# Patient Record
Sex: Male | Born: 1980 | Race: Black or African American | Hispanic: No | Marital: Single | Smoking: Current some day smoker
Health system: Southern US, Community
[De-identification: ages and names within clinical notes are randomized; demographics above are authoritative.]

## PROBLEM LIST (undated history)

## (undated) DIAGNOSIS — I10 Essential (primary) hypertension: Secondary | ICD-10-CM

---
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
## 2002-01-09 ENCOUNTER — Encounter: Payer: Self-pay | Admitting: Emergency Medicine

## 2002-01-09 ENCOUNTER — Emergency Department (HOSPITAL_COMMUNITY): Admission: EM | Admit: 2002-01-09 | Discharge: 2002-01-09 | Payer: Self-pay | Admitting: Emergency Medicine

## 2004-06-04 ENCOUNTER — Ambulatory Visit: Payer: Self-pay | Admitting: Internal Medicine

## 2004-06-20 ENCOUNTER — Ambulatory Visit: Payer: Self-pay | Admitting: Internal Medicine

## 2004-06-20 ENCOUNTER — Ambulatory Visit: Payer: Self-pay | Admitting: *Deleted

## 2004-10-14 ENCOUNTER — Ambulatory Visit: Payer: Self-pay | Admitting: Internal Medicine

## 2004-10-15 ENCOUNTER — Ambulatory Visit (HOSPITAL_COMMUNITY): Admission: RE | Admit: 2004-10-15 | Discharge: 2004-10-15 | Payer: Self-pay | Admitting: Internal Medicine

## 2004-10-21 ENCOUNTER — Ambulatory Visit: Payer: Self-pay | Admitting: Internal Medicine

## 2004-11-10 ENCOUNTER — Ambulatory Visit: Payer: Self-pay | Admitting: Internal Medicine

## 2005-05-18 ENCOUNTER — Ambulatory Visit: Payer: Self-pay | Admitting: Internal Medicine

## 2005-06-12 ENCOUNTER — Ambulatory Visit: Payer: Self-pay | Admitting: Internal Medicine

## 2007-04-07 HISTORY — PX: SPINE SURGERY: SHX786

## 2007-06-13 ENCOUNTER — Inpatient Hospital Stay (HOSPITAL_COMMUNITY): Admission: AC | Admit: 2007-06-13 | Discharge: 2007-06-21 | Payer: Self-pay

## 2008-04-25 ENCOUNTER — Emergency Department (HOSPITAL_COMMUNITY): Admission: EM | Admit: 2008-04-25 | Discharge: 2008-04-25 | Payer: Self-pay | Admitting: Emergency Medicine
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
## 2010-12-29 LAB — CBC
HCT: 27.4 — ABNORMAL LOW
HCT: 37.4 — ABNORMAL LOW
Hemoglobin: 11.8 — ABNORMAL LOW
Hemoglobin: 12 — ABNORMAL LOW
Hemoglobin: 9.6 — ABNORMAL LOW
Hemoglobin: 9.8 — ABNORMAL LOW
MCHC: 33.8
MCHC: 34.2
MCHC: 34.4
MCHC: 34.8
MCHC: 35
MCV: 92.7
MCV: 92.9
MCV: 93
MCV: 93.3
MCV: 93.8
MCV: 94.2
MCV: 94.4
Platelets: 175
Platelets: 177
Platelets: 216
Platelets: 261
RBC: 2.9 — ABNORMAL LOW
RBC: 3.03 — ABNORMAL LOW
RBC: 3.63 — ABNORMAL LOW
RBC: 3.78 — ABNORMAL LOW
RBC: 4.34
RDW: 12.4
RDW: 12.5
WBC: 10.8 — ABNORMAL HIGH
WBC: 16.4 — ABNORMAL HIGH
WBC: 8.2

## 2010-12-29 LAB — BASIC METABOLIC PANEL
BUN: 6
BUN: 7
BUN: 9
CO2: 26
CO2: 28
CO2: 28
CO2: 28
CO2: 28
Calcium: 8.3 — ABNORMAL LOW
Calcium: 8.3 — ABNORMAL LOW
Calcium: 9
Chloride: 100
Chloride: 103
Chloride: 96
Chloride: 98
Chloride: 99
Chloride: 99
Creatinine, Ser: 0.72
Creatinine, Ser: 0.74
Creatinine, Ser: 0.87
Creatinine, Ser: 0.91
GFR calc Af Amer: >60
GFR calc Af Amer: >60
GFR calc Af Amer: >60
Glucose, Bld: 103 — ABNORMAL HIGH
Glucose, Bld: 128 — ABNORMAL HIGH
Potassium: 3.7
Potassium: 4
Sodium: 132 — ABNORMAL LOW
Sodium: 135
Sodium: 138

## 2010-12-29 LAB — I-STAT 8, (EC8 V) (CONVERTED LAB)
Acid-base deficit: 3 — ABNORMAL HIGH
BUN: 9
Bicarbonate: 20.3
HCT: 41
Hemoglobin: 13.9
Operator id: 277751
Sodium: 141
TCO2: 21
pCO2, Ven: 29.5 — ABNORMAL LOW

## 2010-12-29 LAB — CULTURE, BLOOD (ROUTINE X 2): Culture: NO GROWTH

## 2010-12-29 LAB — URINE CULTURE
Colony Count: NO GROWTH
Culture: NO GROWTH

## 2010-12-29 LAB — POCT I-STAT CREATININE: Creatinine, Ser: 1

## 2010-12-29 LAB — URINALYSIS, ROUTINE W REFLEX MICROSCOPIC
Nitrite: NEGATIVE
Protein, ur: NEGATIVE
Specific Gravity, Urine: 1.022
Urobilinogen, UA: 1

## 2010-12-29 LAB — ABO/RH: ABO/RH(D): B POS

## 2010-12-29 LAB — TYPE AND SCREEN: Antibody Screen: NEGATIVE

## 2010-12-29 LAB — URINE MICROSCOPIC-ADD ON

## 2010-12-29 LAB — PROTIME-INR
INR: 1.2
INR: 1.3
Prothrombin Time: 15.1
Prothrombin Time: 15.5 — ABNORMAL HIGH

## 2010-12-29 LAB — GENTAMICIN LEVEL, RANDOM: Gentamicin Rm: <0.5

## 2010-12-29 LAB — ETHANOL: Alcohol, Ethyl (B): 175 — ABNORMAL HIGH

## 2012-05-30 ENCOUNTER — Ambulatory Visit (INDEPENDENT_AMBULATORY_CARE_PROVIDER_SITE_OTHER): Payer: BC Managed Care – PPO | Admitting: Emergency Medicine

## 2012-05-30 VITALS — BP 110/70 | HR 65 | Temp 97.9°F | Resp 16 | Ht 69.5 in | Wt 136.0 lb

## 2012-05-30 DIAGNOSIS — F1010 Alcohol abuse, uncomplicated: Secondary | ICD-10-CM

## 2012-05-30 DIAGNOSIS — M549 Dorsalgia, unspecified: Secondary | ICD-10-CM

## 2012-05-30 DIAGNOSIS — F1020 Alcohol dependence, uncomplicated: Secondary | ICD-10-CM

## 2012-05-30 DIAGNOSIS — Z0000 Encounter for general adult medical examination without abnormal findings: Secondary | ICD-10-CM

## 2012-05-30 LAB — POCT CBC
HCT, POC: 45.7 % (ref 43.5–53.7)
Hemoglobin: 15.1 g/dL (ref 14.1–18.1)
Lymph, poc: 1.3 (ref 0.6–3.4)
MCH, POC: 30.7 pg (ref 27–31.2)
MCHC: 33 g/dL (ref 31.8–35.4)
MCV: 92.9 fL (ref 80–97)
MPV: 9.6 fL (ref 0–99.8)
POC MID %: 4.6 %M (ref 0–12)
RBC: 4.92 M/uL (ref 4.69–6.13)
WBC: 4.9 10*3/uL (ref 4.6–10.2)

## 2012-05-30 LAB — COMPREHENSIVE METABOLIC PANEL
ALT: 13 U/L (ref 0–53)
BUN: 9 mg/dL (ref 6–23)
CO2: 30 mEq/L (ref 19–32)
Calcium: 9.8 mg/dL (ref 8.4–10.5)
Chloride: 103 mEq/L (ref 96–112)
Creat: 0.9 mg/dL (ref 0.50–1.35)
Glucose, Bld: 76 mg/dL (ref 70–99)
Total Bilirubin: 0.5 mg/dL (ref 0.3–1.2)

## 2012-05-30 LAB — LIPID PANEL
Cholesterol: 90 mg/dL (ref 0–200)
Total CHOL/HDL Ratio: 1.7 Ratio
Triglycerides: 25 mg/dL (ref <150)
VLDL: 5 mg/dL (ref 0–40)

## 2012-05-30 NOTE — Progress Notes (Signed)
@[email or web address] @  Patient ID: [name] MRN: [id number], DOB: 1980/08/31 32 y.o. Date of Encounter: 05/30/2012, 11:45 AM  Primary Physician: No primary provider on file.  Chief Complaint: Physical (CPE)  HPI: 32 y.o. y/o male with history noted below here for CPE.  Doing well. No issues/complaints.  Review of Systems:  Consitutional: No fever, chills, fatigue, night sweats, lymphadenopathy, or weight changes. He recently received a DUI and is here for checkup Eyes: No visual changes, eye redness, or discharge. ENT/Mouth: Ears: No otalgia, tinnitus, hearing loss, discharge. Nose: No congestion, rhinorrhea, sinus pain, or epistaxis. Throat: No sore throat, post nasal drip, or teeth pain. Cardiovascular: No CP, palpitations, diaphoresis, DOE, edema, orthopnea, PND. Respiratory: No cough, hemoptysis, SOB, or wheezing. Gastrointestinal: No anorexia, dysphagia, reflux, pain, nausea, vomiting, hematemesis, diarrhea, constipation, BRBPR, or melena. Genitourinary: No dysuria, frequency, urgency, hematuria, incontinence, nocturia, decreased urinary stream, discharge, impotence, or testicular pain/masses. Musculoskeletal: No decreased ROM, myalgias, stiffness, joint swelling, or weakness. Skin: No rash, erythema, lesion changes, pain, warmth, jaundice, or pruritis. Neurological: No headache, dizziness, syncope, seizures, tremors, memory loss, coordination problems, or paresthesias. Psychological: No anxiety, depression, hallucinations, SI/HI. Endocrine: No fatigue, polydipsia, polyphagia, polyuria, or known diabetes. All other systems were reviewed and are otherwise negative.  History reviewed. No pertinent past medical history.   Past Surgical History  Procedure Laterality Date  . Spine surgery  2009    Home Meds:  Prior to Admission medications   Not on File    Allergies: No Known Allergies  History   Social History  . Marital Status: Single    Spouse Name: N/A    Number of  Children: N/A  . Years of Education: N/A   Occupational History  . Not on file.   Social History Main Topics  . Smoking status: Never Smoker   . Smokeless tobacco: Not on file  . Alcohol Use: 4.0 oz/week    8 drink(s) per week  . Drug Use: No  . Sexually Active: Yes    Birth Control/ Protection: None   Other Topics Concern  . Not on file   Social History Narrative  . No narrative on file    Family History  Problem Relation Age of Onset  . Hypertension Mother     Physical Exam:  Blood pressure 110/70, pulse 65, temperature 97.9 F (36.6 C), temperature source Oral, resp. rate 16, height 5' 9.5" (1.765 m), weight 136 lb (61.689 kg), SpO2 100.00%.  General: Well developed, well nourished, in no acute distress. HEENT: Normocephalic, atraumatic. Conjunctiva pink, sclera non-icteric. Pupils 2 mm constricting to 1 mm, round, regular, and equally reactive to light and accomodation. EOMI. Internal auditory canal clear. TMs with good cone of light and without pathology. Nasal mucosa pink. Nares are without discharge. No sinus tenderness. Oral mucosa pink. Dentition . Pharynx without exudate.   Neck: Supple. Trachea midline. No thyromegaly. Full ROM. No lymphadenopathy. Lungs: Clear to auscultation bilaterally without wheezes, rales, or rhonchi. Breathing is of normal effort and unlabored. Cardiovascular: RRR with S1 S2. No murmurs, rubs, or gallops appreciated. Distal pulses 2+ symmetrically. No carotid or abdominal bruits.  Abdomen: Soft, non-tender, non-distended with normoactive bowel sounds. No hepatosplenomegaly or masses. No rebound/guarding. No CVA tenderness. Without hernias.  Rectal: No external hemorrhoids or fissures. Rectal vault without masses.   Genitourinary:   circumcised male. No penile lesions. Testes descended bilaterally, and smooth without tenderness or masses.  Musculoskeletal: Full range of motion and 5/5 strength throughout. Without swelling, atrophy,  tenderness, crepitus, or  warmth. Extremities without clubbing, cyanosis, or edema. Calves supple. Skin: Warm and moist without erythema, ecchymosis, wounds, or rash. Neuro: A+Ox3. CN II-XII grossly intact. Moves all extremities spontaneously. Full sensation throughout. Normal gait. DTR 2+ throughout upper and lower extremities. Finger to nose intact. Psych:  Responds to questions appropriately with a normal affect.   Results for orders placed in visit on 05/30/12  POCT CBC      Result Value Range   WBC 4.9  4.6 - 10.2 K/uL   Lymph, poc 1.3  0.6 - 3.4   POC LYMPH PERCENT 27.3  10 - 50 %L   MID (cbc) 0.2  0 - 0.9   POC MID % 4.6  0 - 12 %M   POC Granulocyte 3.3  2 - 6.9   Granulocyte percent 68.1  37 - 80 %G   RBC 4.92  4.69 - 6.13 M/uL   Hemoglobin 15.1  14.1 - 18.1 g/dL   HCT, POC 69.6  29.5 - 53.7 %   MCV 92.9  80 - 97 fL   MCH, POC 30.7  27 - 31.2 pg   MCHC 33.0  31.8 - 35.4 g/dL   RDW, POC 28.4     Platelet Count, POC 284  142 - 424 K/uL   MPV 9.6  0 - 99.8 fL   Studies: CBC, CMET, Lipid,      Assessment/Plan:  32 y.o. y/o . Patient here for PE. AA advised.  -  Signed, Earl Lites, MD 05/30/2012 11:45 AM

## 2014-02-11 ENCOUNTER — Emergency Department (INDEPENDENT_AMBULATORY_CARE_PROVIDER_SITE_OTHER)
Admission: EM | Admit: 2014-02-11 | Discharge: 2014-02-11 | Disposition: A | Discharge status: Home / Self Care | Payer: BC Managed Care – PPO | Source: Home / Self Care | Attending: Family Medicine | Admitting: Family Medicine

## 2014-02-11 ENCOUNTER — Encounter (HOSPITAL_COMMUNITY): Payer: Self-pay | Admitting: *Deleted

## 2014-02-11 DIAGNOSIS — S61411A Laceration without foreign body of right hand, initial encounter: Secondary | ICD-10-CM

## 2014-02-11 MED ORDER — POVIDONE-IODINE 10 % EX SOLN
CUTANEOUS | Status: AC
  Filled 2014-02-11: qty 118

## 2014-02-11 MED ORDER — TETANUS-DIPHTH-ACELL PERTUSSIS 5-2.5-18.5 LF-MCG/0.5 IM SUSP
0.5000 mL | Freq: Once | INTRAMUSCULAR | Status: AC
  Administered 2014-02-11: 0.5 mL via INTRAMUSCULAR

## 2014-02-11 MED ORDER — TETANUS-DIPHTH-ACELL PERTUSSIS 5-2.5-18.5 LF-MCG/0.5 IM SUSP
INTRAMUSCULAR | Status: AC
  Filled 2014-02-11: qty 0.5

## 2014-02-11 NOTE — Discharge Instructions (Signed)
Use neosporin ointment twice a day, wash as needed, ok to work as planned.

## 2014-02-11 NOTE — ED Provider Notes (Signed)
CSN: [id number]     Arrival date & time 02/11/14  1512 History   First MD Initiated Contact with Patient 02/11/14 1554     Chief Complaint  Patient presents with  . Laceration   (Consider location/radiation/quality/duration/timing/severity/associated sxs/prior Treatment) Patient is a 33 y.o. male presenting with skin laceration. The history is provided by the patient.  Laceration Location:  Hand Hand laceration location:  Dorsum of R hand Length (cm):  1.0 Depth:  Cutaneous Quality: straight   Bleeding: controlled   Time since incident:  1 day Laceration mechanism:  Metal edge Pain details:    Severity:  Mild   Progression:  Unchanged Foreign body present:  No foreign bodies Worsened by:  Nothing tried Ineffective treatments:  None tried Tetanus status:  Unknown   History reviewed. No pertinent past medical history. Past Surgical History  Procedure Laterality Date  . Spine surgery  2009   Family History  Problem Relation Age of Onset  . Hypertension Mother    History  Substance Use Topics  . Smoking status: Never Smoker   . Smokeless tobacco: Not on file  . Alcohol Use: 4.0 oz/week    8 drink(s) per week    Review of Systems  Constitutional: Negative.   Skin: Positive for wound.    Allergies  Review of patient's allergies indicates no known allergies.  Home Medications   Prior to Admission medications   Not on File   BP 135/82 mmHg  Pulse 95  Temp(Src) 98 F (36.7 C) (Oral)  Resp 18  SpO2 98% Physical Exam  Constitutional: He is oriented to person, place, and time. He appears well-developed and well-nourished.  Musculoskeletal: He exhibits no tenderness.       Right hand: He exhibits laceration. Normal sensation noted. Normal strength noted.       Hands: Neurological: He is alert and oriented to person, place, and time.  Skin: Skin is warm and dry.  Nursing note and vitals reviewed.   ED Course  Procedures (including critical care time) Labs  Review Labs Reviewed - No data to display  Imaging Review No results found.   MDM   1. Hand laceration, right, initial encounter        Linna HoffJames D Atlee Kluth, MD 02/11/14 [phone]

## 2014-02-11 NOTE — ED Notes (Addendum)
Pt   Sustained    A laceration     To   r  Hand             On  A  Hamburger      Grinder      yesterday       Bleeding   Subsided   rom  Is  Present      Tetanus         Shot  Unknown

## 2015-06-04 ENCOUNTER — Emergency Department (HOSPITAL_COMMUNITY)
Admission: EM | Admit: 2015-06-04 | Discharge: 2015-06-04 | Disposition: A | Discharge status: Home / Self Care | Payer: BLUE CROSS/BLUE SHIELD | Source: Home / Self Care | Attending: Emergency Medicine | Admitting: Emergency Medicine

## 2015-06-04 ENCOUNTER — Encounter (HOSPITAL_COMMUNITY): Payer: Self-pay | Admitting: Emergency Medicine

## 2015-06-04 DIAGNOSIS — R6889 Other general symptoms and signs: Secondary | ICD-10-CM | POA: Diagnosis not present

## 2015-06-04 NOTE — ED Provider Notes (Signed)
CSN: [id number]     Arrival date & time 06/04/15  1341 History   First MD Initiated Contact with Patient 06/04/15 1442     Chief Complaint  Patient presents with  . URI   (Consider location/radiation/quality/duration/timing/severity/associated sxs/prior Treatment) HPI  He is a 35 year old man here for evaluation of body aches. He states his symptoms started on Friday with body aches, chills, subjective fever, and runny nose. He does report some mild sore throat. He denies any coughing, wheezing, or shortness of breath. No nausea or vomiting. He has been taking ibuprofen and Alka-Seltzer with some improvement. He states that most of his symptoms have resolved, but he continues to have body aches and fatigue today.  History reviewed. No pertinent past medical history. Past Surgical History  Procedure Laterality Date  . Spine surgery  2009   Family History  Problem Relation Age of Onset  . Hypertension Mother    Social History  Substance Use Topics  . Smoking status: Never Smoker   . Smokeless tobacco: None  . Alcohol Use: 4.0 oz/week    8 drink(s) per week    Review of Systems As in history of present illness Allergies  Review of patient's allergies indicates no known allergies.  Home Medications   Prior to Admission medications   Medication Sig Start Date End Date Taking? Authorizing Provider  ibuprofen (ADVIL,MOTRIN) 400 MG tablet Take 400 mg by mouth every 6 (six) hours as needed.   Yes Historical Provider, MD  OVER THE COUNTER MEDICATION Cold medicine   Yes Historical Provider, MD   Meds Ordered and Administered this Visit  Medications - No data to display  BP 126/74 mmHg  Pulse 87  Temp(Src) 98.3 F (36.8 C) (Oral)  Resp 16  SpO2 99% No data found.   Physical Exam  Constitutional: He is oriented to person, place, and time. He appears well-developed and well-nourished. No distress.  HENT:  Nose: Nose normal.  Mouth/Throat: No oropharyngeal exudate.  Mild  oropharyngeal erythema.  Neck: Neck supple.  Cardiovascular: Normal rate, regular rhythm and normal heart sounds.   No murmur heard. Pulmonary/Chest: Effort normal and breath sounds normal. No respiratory distress. He has no wheezes. He has no rales.  Lymphadenopathy:    He has no cervical adenopathy.  Neurological: He is alert and oriented to person, place, and time.    ED Course  Procedures (including critical care time)  Labs Review Labs Reviewed - No data to display  Imaging Review No results found.    MDM   1. Flu-like symptoms    He likely had the flu. He appears to be over the worst of the symptoms. Recommended continued Alka-Seltzer and ibuprofen as needed. Expect body aches to resolve over the next 2-3 days. Fatigue may take another week. Recommended flu shot for next year. Follow-up as needed.    Charm Rings, MD 06/04/15 [phone]

## 2015-06-04 NOTE — ED Notes (Signed)
Body aches, chills, runny nose, denies cough, denies ear pain, no nausea, no vomiting.  Reports sore throat.

## 2015-06-04 NOTE — Discharge Instructions (Signed)
You probably had the flu. It sounds like you are over the worst of it. The body aches should resolve in the next 2-3 days. Continue Alka-Seltzer and ibuprofen as needed. You are okay to return to work. It might take another week for the fatigue to resolve. I would recommend getting a flu shot next year. Follow-up as needed.

## 2016-06-13 ENCOUNTER — Emergency Department (HOSPITAL_COMMUNITY): Payer: BLUE CROSS/BLUE SHIELD

## 2016-06-13 ENCOUNTER — Encounter (HOSPITAL_COMMUNITY): Payer: Self-pay | Admitting: *Deleted

## 2016-06-13 ENCOUNTER — Emergency Department (HOSPITAL_COMMUNITY)
Admission: EM | Admit: 2016-06-13 | Discharge: 2016-06-13 | Disposition: A | Discharge status: Home / Self Care | Payer: BLUE CROSS/BLUE SHIELD | Attending: Emergency Medicine | Admitting: Emergency Medicine

## 2016-06-13 DIAGNOSIS — S0990XA Unspecified injury of head, initial encounter: Secondary | ICD-10-CM | POA: Diagnosis not present

## 2016-06-13 DIAGNOSIS — F10920 Alcohol use, unspecified with intoxication, uncomplicated: Secondary | ICD-10-CM

## 2016-06-13 DIAGNOSIS — Y929 Unspecified place or not applicable: Secondary | ICD-10-CM | POA: Insufficient documentation

## 2016-06-13 DIAGNOSIS — Y999 Unspecified external cause status: Secondary | ICD-10-CM | POA: Diagnosis not present

## 2016-06-13 DIAGNOSIS — S01112A Laceration without foreign body of left eyelid and periocular area, initial encounter: Secondary | ICD-10-CM | POA: Insufficient documentation

## 2016-06-13 DIAGNOSIS — Y939 Activity, unspecified: Secondary | ICD-10-CM | POA: Diagnosis not present

## 2016-06-13 DIAGNOSIS — S0181XA Laceration without foreign body of other part of head, initial encounter: Secondary | ICD-10-CM | POA: Diagnosis present

## 2016-06-13 DIAGNOSIS — I10 Essential (primary) hypertension: Secondary | ICD-10-CM | POA: Insufficient documentation

## 2016-06-13 DIAGNOSIS — F10120 Alcohol abuse with intoxication, uncomplicated: Secondary | ICD-10-CM | POA: Insufficient documentation

## 2016-06-13 HISTORY — DX: Essential (primary) hypertension: I10

## 2016-06-13 MED ORDER — LIDOCAINE HCL 2 % IJ SOLN
20.0000 mL | Freq: Once | INTRAMUSCULAR | Status: AC
  Administered 2016-06-13: 400 mg
  Filled 2016-06-13: qty 20

## 2016-06-13 NOTE — ED Notes (Signed)
Pt transported to CT ?
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
## 2016-06-13 NOTE — ED Notes (Signed)
Patient is resting comfortably. 
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
## 2016-06-13 NOTE — ED Notes (Addendum)
Family at bedside.mother and brother
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
## 2016-06-13 NOTE — Discharge Instructions (Signed)
Tylenol and Motrin for pain.  Return here as needed.  Follow-up with primary care doctor, have sutures out in 5 days.  The area clean and dry.  Do not apply any topical ointments to the wound
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
## 2018-07-24 ENCOUNTER — Encounter (HOSPITAL_COMMUNITY): Payer: Self-pay | Admitting: Emergency Medicine

## 2018-07-24 ENCOUNTER — Emergency Department (HOSPITAL_COMMUNITY)
Admission: EM | Admit: 2018-07-24 | Discharge: 2018-07-24 | Disposition: A | Discharge status: Home / Self Care | Payer: BLUE CROSS/BLUE SHIELD | Attending: Emergency Medicine | Admitting: Emergency Medicine

## 2018-07-24 ENCOUNTER — Other Ambulatory Visit: Payer: Self-pay

## 2018-07-24 DIAGNOSIS — R6883 Chills (without fever): Secondary | ICD-10-CM | POA: Diagnosis present

## 2018-07-24 DIAGNOSIS — I10 Essential (primary) hypertension: Secondary | ICD-10-CM | POA: Diagnosis not present

## 2018-07-24 DIAGNOSIS — F1020 Alcohol dependence, uncomplicated: Secondary | ICD-10-CM | POA: Diagnosis not present

## 2018-07-24 NOTE — Discharge Instructions (Signed)
Please return if worsening.

## 2018-07-24 NOTE — ED Notes (Signed)
Pt. Stated, employer sent me to be tested for COVID . Pt had a sore throat last week, pt has worked Engineer, manufacturing.

## 2018-07-24 NOTE — ED Triage Notes (Signed)
Pt here requesting a COVID-19 Test because a co-worker passes away last week possibly from same. Pt states he has a sore throat last week but no other symptoms since. Pt reports he job sent him here.

## 2018-07-24 NOTE — ED Provider Notes (Signed)
[facility] EMERGENCY DEPARTMENT Provider Note   CSN: [id number] Arrival date & time: 07/24/18  0854    History   Chief Complaint Chief Complaint  Patient presents with  . Requesting COVID Test    HPI [name] is a 38 y.o. male who presents for evaluation for COVID. No significant PMH. He states that over the past week he has had some chills and an irritated throat. He works for [employer] and a Radio broadcast assistant passed away (he is unsure why) and since then everyone has been on "pins and needles". He told his supervisor that he had a sore throat and she told him to come to the ED to be tested for COVID. He thinks his symptoms are from sleeping under a fan the other night which always causes these symptoms. He states that once he drinks his coffee in the morning he feels a lot better and his symptoms go away. He denies fever, URI symptoms, chest pain, SOB, cough.      HPI  Past Medical History:  Diagnosis Date  . Hypertension     Patient Active Problem List   Diagnosis Date Noted  . Other and unspecified alcohol dependence, unspecified drinking behavior 05/30/2012    Past Surgical History:  Procedure Laterality Date  . SPINE SURGERY  2009        Home Medications    Prior to Admission medications   Not on File    Family History Family History  Problem Relation Age of Onset  . Hypertension Mother     Social History Social History   Tobacco Use  . Smoking status: Never Smoker  . Smokeless tobacco: Never Used  Substance Use Topics  . Alcohol use: Yes    Alcohol/week: 8.0 standard drinks    Types: 8 Standard drinks or equivalent per week  . Drug use: No     Allergies   Patient has no known allergies.   Review of Systems Review of Systems  Constitutional: Positive for chills. Negative for fever.  HENT: Positive for sore throat. Negative for sneezing.   Respiratory: Negative for cough and shortness of breath.   Cardiovascular: Negative for  chest pain.     Physical Exam Updated Vital Signs BP (!) 157/109 (BP Location: Right Arm)   Pulse 93   Temp 98.2 F (36.8 C) (Oral)   Resp 14   Ht 5\' 8"  (1.727 m)   Wt 63.5 kg   SpO2 100%   BMI 21.29 kg/m   Physical Exam Vitals signs and nursing note reviewed.  Constitutional:      General: He is not in acute distress.    Appearance: Normal appearance. He is well-developed. He is not ill-appearing.     Comments: Well appearing male in NAD  HENT:     Head: Normocephalic and atraumatic.     Right Ear: Tympanic membrane normal.     Left Ear: Tympanic membrane normal.     Nose: Nose normal.  Eyes:     General: No scleral icterus.       Right eye: No discharge.        Left eye: No discharge.     Conjunctiva/sclera: Conjunctivae normal.     Pupils: Pupils are equal, round, and reactive to light.  Neck:     Musculoskeletal: Normal range of motion.  Cardiovascular:     Rate and Rhythm: Normal rate and regular rhythm.  Pulmonary:     Effort: Pulmonary effort is normal. No respiratory distress.  Breath sounds: Normal breath sounds.  Abdominal:     General: There is no distension.  Skin:    General: Skin is warm and dry.  Neurological:     Mental Status: He is alert and oriented to person, place, and time.  Psychiatric:        Behavior: Behavior normal.      ED Treatments / Results  Labs (all labs ordered are listed, but only abnormal results are displayed) Labs Reviewed - No data to display  EKG None  Radiology No results found.  Procedures Procedures (including critical care time)  Medications Ordered in ED Medications - No data to display   Initial Impression / Assessment and Plan / ED Course  I have reviewed the triage vital signs and the nursing notes.  Pertinent labs & imaging results that were available during my care of the patient were reviewed by me and considered in my medical decision making (see chart for details).  38 year old male  presents with chills and irritated throat. Most likely sounds like allergies vs cold. No fever or cough therefore doubt COVID or pneumonia. He is hypertensive but otherwise vitals are normal. I advised him that we are not testing low risk people for COVID at this time and he verbalized understanding. He was given a work note.  Final Clinical Impressions(s) / ED Diagnoses   Final diagnoses:  Chills    ED Discharge Orders    None       Bethel BornGekas, Mahala Rommel Marie, PA-C 07/24/18 [phone]    Linwood DibblesKnapp, Jon, MD 07/24/18 1037

## 2018-07-24 NOTE — ED Notes (Signed)
Patient verbalizes understanding of discharge instructions. Opportunity for questioning and answers were provided. Armband removed by staff, pt discharged from ED home via POV.  

## 2023-05-25 ENCOUNTER — Encounter (HOSPITAL_COMMUNITY): Payer: Self-pay

## 2023-05-25 ENCOUNTER — Ambulatory Visit (HOSPITAL_COMMUNITY)
Admission: RE | Admit: 2023-05-25 | Discharge: 2023-05-25 | Disposition: A | Discharge status: Home / Self Care | Payer: BC Managed Care – PPO | Source: Ambulatory Visit

## 2023-05-25 VITALS — BP 134/72 | HR 95 | Temp 98.0°F | Resp 16 | Ht 69.0 in | Wt 170.0 lb

## 2023-05-25 DIAGNOSIS — D171 Benign lipomatous neoplasm of skin and subcutaneous tissue of trunk: Secondary | ICD-10-CM

## 2023-05-25 NOTE — ED Provider Notes (Signed)
[facility]    CSN: [id number] Arrival date & time: 05/25/23  1752    HISTORY   Chief Complaint  Patient presents with   Mass   HPI Shown Dissinger is a pleasant, 43 y.o. male who presents to urgent care today. Patient complains of a mass on the right side of his torso that he noticed 2 days ago in the shower.  Patient states the mass is not painful, it is firm, mobile.  Upon further questioning, patient states it may have been there longer but he really fully noticed it 2 days ago.  Patient states he tried to look up on the Internet and was concerned that it may be a tumor.  Patient states that he does have a primary care provider.  The history is provided by the patient.   Past Medical History:  Diagnosis Date   Hypertension    Patient Active Problem List   Diagnosis Date Noted   Other and unspecified alcohol dependence, unspecified drinking behavior 05/30/2012   Past Surgical History:  Procedure Laterality Date   SPINE SURGERY  2009    Home Medications    Prior to Admission medications   Not on File    Family History Family History  Problem Relation Age of Onset   Hypertension Mother    Social History Social History   Tobacco Use   Smoking status: Some Days    Types: Cigars   Smokeless tobacco: Never  Vaping Use   Vaping status: Never Used  Substance Use Topics   Alcohol use: Yes    Alcohol/week: 44.0 standard drinks of alcohol    Types: 8 Standard drinks or equivalent, 36 Cans of beer per week   Drug use: No   Allergies   Patient has no known allergies.  Review of Systems Review of Systems Pertinent findings revealed after performing a 14 point review of systems has been noted in the history of present illness.  Physical Exam Vital Signs BP 134/72 (BP Location: Left Arm)   Pulse 95   Temp 98 F (36.7 C) (Oral)   Resp 16   Ht 5\' 9"  (1.753 m)   Wt 170 lb (77.1 kg)   SpO2 96%   BMI 25.10 kg/m   No data found.  Physical  Exam Vitals and nursing note reviewed.  Constitutional:      General: He is not in acute distress.    Appearance: Normal appearance. He is normal weight. He is not ill-appearing.  HENT:     Head: Normocephalic and atraumatic.  Eyes:     Extraocular Movements: Extraocular movements intact.     Conjunctiva/sclera: Conjunctivae normal.     Pupils: Pupils are equal, round, and reactive to light.  Cardiovascular:     Rate and Rhythm: Normal rate and regular rhythm.  Pulmonary:     Effort: Pulmonary effort is normal.     Breath sounds: Normal breath sounds.  Musculoskeletal:        General: Normal range of motion.     Cervical back: Normal range of motion and neck supple.  Skin:    General: Skin is warm and dry.       Neurological:     General: No focal deficit present.     Mental Status: He is alert and oriented to person, place, and time. Mental status is at baseline.  Psychiatric:        Mood and Affect: Mood normal.        Behavior: Behavior normal.  Thought Content: Thought content normal.        Judgment: Judgment normal.     Visual Acuity Right Eye Distance:   Left Eye Distance:   Bilateral Distance:    Right Eye Near:   Left Eye Near:    Bilateral Near:     UC Couse / Diagnostics / Procedures:     Radiology No results found.  Procedures Procedures (including critical care time) EKG  Pending results:  Labs Reviewed - No data to display  Medications Ordered in UC: Medications - No data to display  UC Diagnoses / Final Clinical Impressions(s)   I have reviewed the triage vital signs and the nursing notes.  Pertinent labs & imaging results that were available during my care of the patient were reviewed by me and considered in my medical decision making (see chart for details).    Final diagnoses:  Lipoma of torso   Patient advised of physical exam findings.  Recommend follow-up with PCP to discuss referral to dermatology or general surgery to  have the lipoma removed should become larger or more bothersome.  Patient verbalized understanding.  Please see discharge instructions below for details of plan of care as provided to patient. ED Prescriptions   None    PDMP not reviewed this encounter.  Pending results:  Labs Reviewed - No data to display    Discharge Instructions      I have enclosed information about lipomas that I hope you find helpful.  No treatment is needed for this benign lesion.    If you are interested in having the lipoma removed, particularly if it gets too big or becomes bothersome, this is typically done either by dermatologist or general surgeon.  Sometimes primary care providers will remove them but, in my opinion, a dermatologist or general surgeon will do a better job just because they have better tools to do so.  Thank you for visiting [facility] today.      Disposition Upon Discharge:  Condition: stable for discharge home  Patient presented with an acute illness with associated systemic symptoms and significant discomfort requiring urgent management. In my opinion, this is a condition that a prudent lay person (someone who possesses an average knowledge of health and medicine) may potentially expect to result in complications if not addressed urgently such as respiratory distress, impairment of bodily function or dysfunction of bodily organs.   Routine symptom specific, illness specific and/or disease specific instructions were discussed with the patient and/or caregiver at length.   As such, the patient has been evaluated and assessed, work-up was performed and treatment was provided in alignment with urgent care protocols and evidence based medicine.  Patient/parent/caregiver has been advised that the patient may require follow up for further testing and treatment if the symptoms continue in spite of treatment, as clinically indicated and appropriate.  Patient/parent/caregiver  has been advised to return to the [facility] or PCP if no better; to PCP or the Emergency Department if new signs and symptoms develop, or if the current signs or symptoms continue to change or worsen for further workup, evaluation and treatment as clinically indicated and appropriate  The patient will follow up with their current PCP if and as advised. If the patient does not currently have a PCP we will assist them in obtaining one.   The patient may need specialty follow up if the symptoms continue, in spite of conservative treatment and management, for further workup, evaluation, consultation and treatment as  clinically indicated and appropriate.  Patient/parent/caregiver verbalized understanding and agreement of plan as discussed.  All questions were addressed during visit.  Please see discharge instructions below for further details of plan.  This office note has been dictated using Teaching laboratory technician.  Unfortunately, this method of dictation can sometimes lead to typographical or grammatical errors.  I apologize for your inconvenience in advance if this occurs.  Please do not hesitate to reach out to me if clarification is needed.      Theadora Rama Scales, PA-C 05/26/23 1526

## 2023-05-25 NOTE — Discharge Instructions (Addendum)
I have enclosed information about lipomas that I hope you find helpful.  No treatment is needed for this benign lesion.    If you are interested in having the lipoma removed, particularly if it gets too big or becomes bothersome, this is typically done either by dermatologist or general surgeon.  Sometimes primary care providers will remove them but, in my opinion, a dermatologist or general surgeon will do a better job just because they have better tools to do so.  Thank you for visiting [facility] today.

## 2023-05-25 NOTE — ED Triage Notes (Signed)
Patient here today with c/o a mass on his right side torso that he noticed 2 days ago when he got out of the shower. Denies pain. Mass is hard.

## 2024-04-29 ENCOUNTER — Inpatient Hospital Stay (HOSPITAL_COMMUNITY): Admission: RE | Admit: 2024-04-29 | Discharge: 2024-04-29 | Payer: Self-pay

## 2024-04-29 ENCOUNTER — Other Ambulatory Visit: Payer: Self-pay

## 2024-04-29 ENCOUNTER — Encounter (HOSPITAL_COMMUNITY): Payer: Self-pay

## 2024-04-29 VITALS — BP 131/87 | HR 98 | Temp 98.9°F | Resp 18

## 2024-04-29 DIAGNOSIS — J029 Acute pharyngitis, unspecified: Secondary | ICD-10-CM

## 2024-04-29 DIAGNOSIS — J988 Other specified respiratory disorders: Secondary | ICD-10-CM | POA: Diagnosis not present

## 2024-04-29 DIAGNOSIS — B9789 Other viral agents as the cause of diseases classified elsewhere: Secondary | ICD-10-CM

## 2024-04-29 LAB — POCT RAPID STREP A (OFFICE): Rapid Strep A Screen: NEGATIVE

## 2024-04-29 LAB — POCT INFLUENZA A/B
Influenza A, POC: NEGATIVE
Influenza B, POC: NEGATIVE

## 2024-04-29 LAB — POC SOFIA SARS ANTIGEN FIA: SARS Coronavirus 2 Ag: NEGATIVE

## 2024-04-29 MED ORDER — PROMETHAZINE-DM 6.25-15 MG/5ML PO SYRP: 5.0000 mL | ORAL_SOLUTION | Freq: Every evening | ORAL | 0 refills | Status: AC | PRN

## 2024-04-29 NOTE — ED Provider Notes (Signed)
 " [facility]    CSN: [id number] Arrival date & time: 04/29/24  9077      History   Chief Complaint Chief Complaint  Patient presents with   Appointment    9:30   Sore Throat    HPI [name] is a 44 y.o. male.   This 44 year old male is being seen for complaints of fever, chills, bodyaches, fatigue, sneezing, rhinorrhea, sore throat, nonproductive cough, lack of appetite.  He reports he initially had a tingling sensation in his throat approximately 1 week ago.  Remainder of symptoms ongoing for 2 days.  He has been taking ibuprofen, Mucinex, honey tea without significant relief of symptoms.  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  He denies dizziness, headache, ear pain.  He denies chest pain, shortness of breath, abdominal pain, nausea, vomiting, diarrhea.   Sore Throat Pertinent negatives include no chest pain, no abdominal pain, no headaches and no shortness of breath.    Past Medical History:  Diagnosis Date   Hypertension     Patient Active Problem List   Diagnosis Date Noted   Other and unspecified alcohol dependence, unspecified drinking behavior 05/30/2012    Past Surgical History:  Procedure Laterality Date   SPINE SURGERY  2009       Home Medications    Prior to Admission medications  Medication Sig Start Date End Date Taking? Authorizing Provider  promethazine -dextromethorphan (PROMETHAZINE -DM) 6.25-15 MG/5ML syrup Take 5 mLs by mouth at bedtime as needed for cough. 04/29/24  Yes Lennice Jon BROCKS, FNP    Family History Family History  Problem Relation Age of Onset   Hypertension Mother     Social History Social History[1]   Allergies   Patient has no known allergies.   Review of Systems Review of Systems  Constitutional:  Positive for activity change, appetite change, chills, fatigue and fever.  HENT:  Positive for congestion, rhinorrhea, sneezing and sore throat. Negative for ear pain.   Eyes:  Negative for pain and  visual disturbance.  Respiratory:  Positive for cough. Negative for shortness of breath.   Cardiovascular:  Negative for chest pain.  Gastrointestinal:  Negative for abdominal pain, diarrhea, nausea and vomiting.  Musculoskeletal:  Positive for myalgias.  Skin:  Negative for color change and rash.  Neurological:  Negative for dizziness and headaches.     Physical Exam Triage Vital Signs ED Triage Vitals  Encounter Vitals Group     BP 04/29/24 0940 131/87     Girls Systolic BP Percentile --      Girls Diastolic BP Percentile --      Boys Systolic BP Percentile --      Boys Diastolic BP Percentile --      Pulse Rate 04/29/24 0940 98     Resp 04/29/24 0940 18     Temp 04/29/24 0940 98.9 F (37.2 C)     Temp Source 04/29/24 0940 Oral     SpO2 04/29/24 0940 97 %     Weight --      Height --      Head Circumference --      Peak Flow --      Pain Score 04/29/24 0937 4     Pain Loc --      Pain Education --      Exclude from Growth Chart --    No data found.  Updated Vital Signs BP 131/87 (BP Location: Left Arm)   Pulse 98   Temp 98.9 F (37.2  C) (Oral)   Resp 18   SpO2 97%   Visual Acuity Right Eye Distance:   Left Eye Distance:   Bilateral Distance:    Right Eye Near:   Left Eye Near:    Bilateral Near:     Physical Exam Vitals and nursing note reviewed.  Constitutional:      General: He is not in acute distress.    Appearance: He is well-developed. He is not toxic-appearing.     Comments: Pleasant male appearing stated age found sitting in chair in no acute distress.  HENT:     Head: Normocephalic and atraumatic.     Right Ear: Tympanic membrane and external ear normal.     Left Ear: Tympanic membrane and external ear normal.     Nose: Congestion and rhinorrhea present.     Right Turbinates: Enlarged.     Left Turbinates: Enlarged.     Mouth/Throat:     Lips: Pink.     Mouth: Mucous membranes are moist.     Pharynx: Posterior oropharyngeal erythema  present. No pharyngeal swelling or oropharyngeal exudate.  Eyes:     Conjunctiva/sclera: Conjunctivae normal.  Cardiovascular:     Rate and Rhythm: Normal rate and regular rhythm.     Heart sounds: Normal heart sounds. No murmur heard. Pulmonary:     Effort: Pulmonary effort is normal. No respiratory distress.     Breath sounds: Normal breath sounds.  Abdominal:     General: Bowel sounds are normal.     Palpations: Abdomen is soft.     Tenderness: There is no abdominal tenderness.  Musculoskeletal:     Cervical back: Neck supple.  Skin:    General: Skin is warm and dry.     Capillary Refill: Capillary refill takes less than 2 seconds.  Neurological:     Mental Status: He is alert.  Psychiatric:        Mood and Affect: Mood normal.      UC Treatments / Results  Labs (all labs ordered are listed, but only abnormal results are displayed) Labs Reviewed  POC SOFIA SARS ANTIGEN FIA - Normal  POCT INFLUENZA A/B - Normal  POCT RAPID STREP A (OFFICE) - Normal    EKG   Radiology No results found.  Procedures Procedures (including critical care time)  Medications Ordered in UC Medications - No data to display  Initial Impression / Assessment and Plan / UC Course  I have reviewed the triage vital signs and the nursing notes.  Pertinent labs & imaging results that were available during my care of the patient were reviewed by me and considered in my medical decision making (see chart for details).     Vitals in triage reviewed, patient is hemodynamically stable.  COVID, flu, strep swabs obtained and are negative.  Presentation consistent with viral respiratory illness.  He is given a prescription for promethazine -DM cough syrup.  He is advised supportive care with rest, fluids, Tylenol and/or ibuprofen, guaifenesin, saline nasal spray.  Advised honey water, salt water gargles, cool-mist humidifier.  Plan of care, follow-up care, return precautions given, no questions at this  time.  He is provided with a work note. Final Clinical Impressions(s) / UC Diagnoses   Final diagnoses:  Sore throat  Viral respiratory illness     Discharge Instructions      You have a viral illness which will improve on its own with rest, fluids, and medications to help with your symptoms.  Tylenol and/or ibuprofen for  fever, pain, body  aches. Guaifenesin (plain mucinex) for cough, congestion. Saline nasal sprays for nasal congestion, drainage.   Promethazine -Dextromethorphan for cough.  Take this primarily at bedtime as the medication will make you sleepy.  Two teaspoons of honey in 1 cup of warm water every 4-6 hours may help with throat pains.  Salt water gargles, 1/2-1 teaspoon of salt dissolved in 1 cup of warm water, gargle and spit out every 4-6 hours to help with throat pains.  Humidifier in room at nighttime may help soothe cough (clean well daily).   For chest pain, shortness of breath, inability to keep food or fluids down without vomiting, fever that does not respond to tylenol or motrin, or any other severe symptoms, please go to the ER for further evaluation. Return to urgent care as needed, otherwise follow-up with PCP.       ED Prescriptions     Medication Sig Dispense Auth. Provider   promethazine -dextromethorphan (PROMETHAZINE -DM) 6.25-15 MG/5ML syrup Take 5 mLs by mouth at bedtime as needed for cough. 118 mL Almyra Birman C, FNP      PDMP not reviewed this encounter.    [1]  Social History Tobacco Use   Smoking status: Some Days    Types: Cigars   Smokeless tobacco: Never  Vaping Use   Vaping status: Never Used  Substance Use Topics   Alcohol use: Yes    Alcohol/week: 44.0 standard drinks of alcohol    Types: 8 Standard drinks or equivalent, 36 Cans of beer per week   Drug use: No     Lennice Jon BROCKS, FNP 04/29/24 1044  "

## 2024-04-29 NOTE — Discharge Instructions (Addendum)
 You have a viral illness which will improve on its own with rest, fluids, and medications to help with your symptoms.  Tylenol and/or ibuprofen for fever, pain, body  aches. Guaifenesin (plain mucinex) for cough, congestion. Saline nasal sprays for nasal congestion, drainage.   Promethazine -Dextromethorphan for cough.  Take this primarily at bedtime as the medication will make you sleepy.  Two teaspoons of honey in 1 cup of warm water every 4-6 hours may help with throat pains.  Salt water gargles, 1/2-1 teaspoon of salt dissolved in 1 cup of warm water, gargle and spit out every 4-6 hours to help with throat pains.  Humidifier in room at nighttime may help soothe cough (clean well daily).   For chest pain, shortness of breath, inability to keep food or fluids down without vomiting, fever that does not respond to tylenol or motrin, or any other severe symptoms, please go to the ER for further evaluation. Return to urgent care as needed, otherwise follow-up with PCP.

## 2024-04-29 NOTE — ED Triage Notes (Signed)
 Reports symptoms onset for a week.  There was tickling in throat.  As of 2 days ago, throat became sore, hard to swallow, body aches, feeling weak, poor appetite.  Early morning, symptoms are not bad, as day goes on, symptoms and pain worsens  Patient works in a freezer.    Patient has been taking mucinex, honey tea, and ibuprofen
